# Patient Record
Sex: Male | Born: 1995 | Race: Black or African American | Hispanic: No | Marital: Single
Health system: Southern US, Community
[De-identification: ages and names within clinical notes are randomized; demographics above are authoritative.]

## PROBLEM LIST (undated history)

## (undated) DIAGNOSIS — R519 Headache, unspecified: Secondary | ICD-10-CM

## (undated) DIAGNOSIS — R51 Headache: Secondary | ICD-10-CM

---
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
## 2015-09-28 IMAGING — CT CT HEAD W/O CM
2 series · 16 of 30 positions shown, 20 images · non-contrast
Comparison: None.

CLINICAL DATA: Right-sided headache.

EXAM:
CT HEAD WITHOUT CONTRAST
TECHNIQUE: Contiguous axial images were obtained from the base of the skull
through the vertex without intravenous contrast.

[Series 201: head w/o, idose (1) · axial · non-contrast · 0.49mm/px · z∈[+99,+229]mm · 13 of 32 slices shown, 17 images]
[im 3/32  brain]
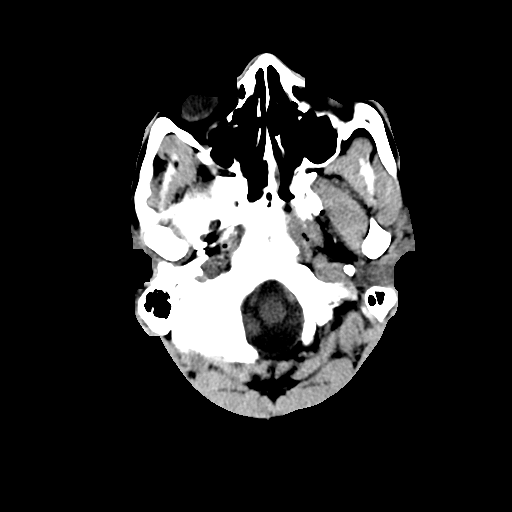
[im 3/32  bone]
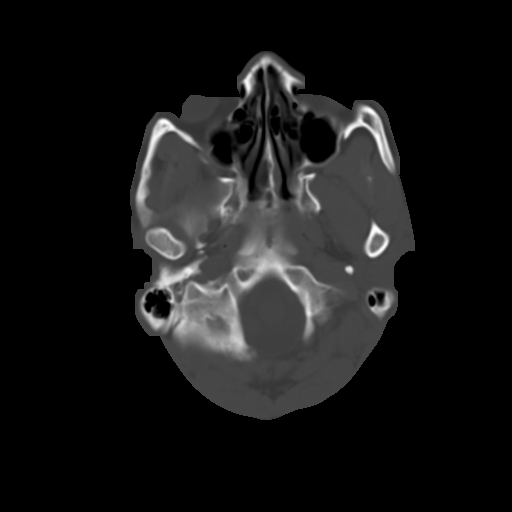
[im 5/32  brain]
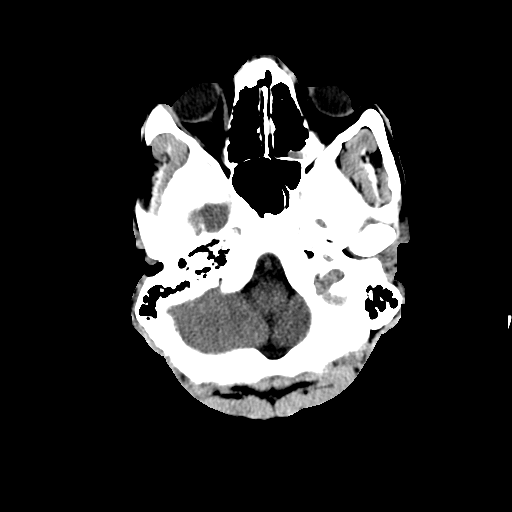
[im 7/32  brain]
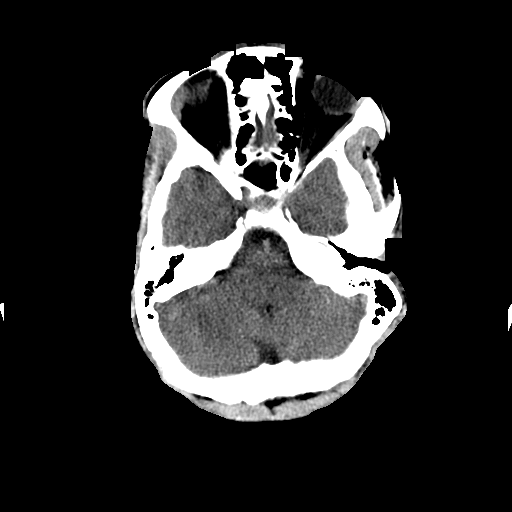
[im 9/32  brain]
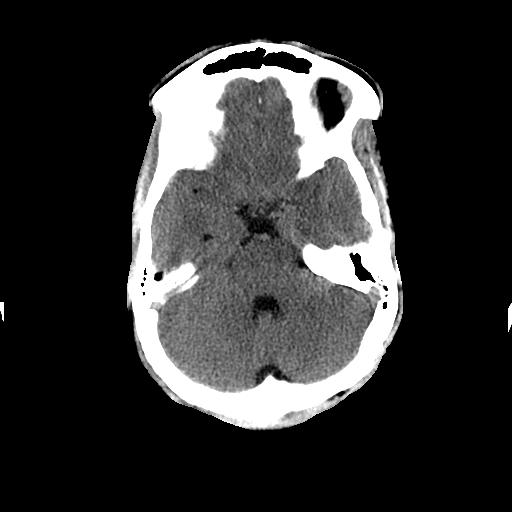
[im 12/32  brain]
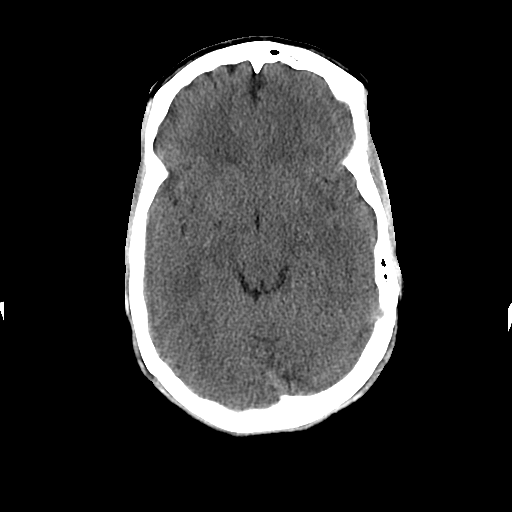
[im 12/32  bone]
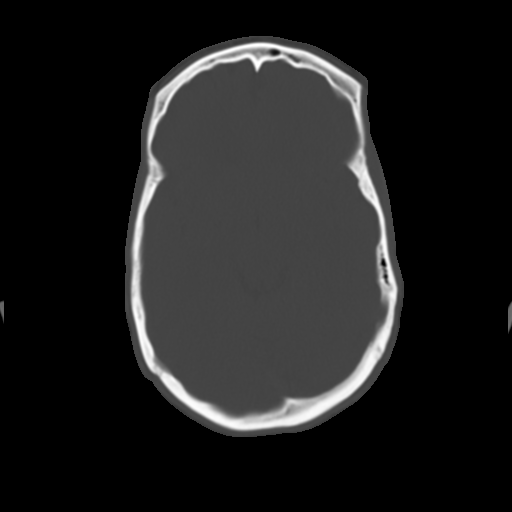
[im 14/32  brain]
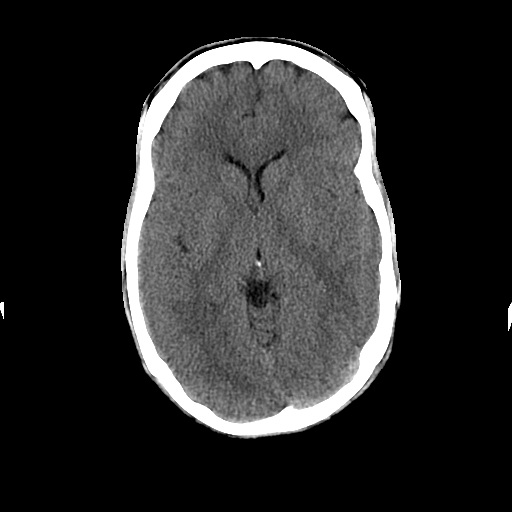
[im 16/32  brain]
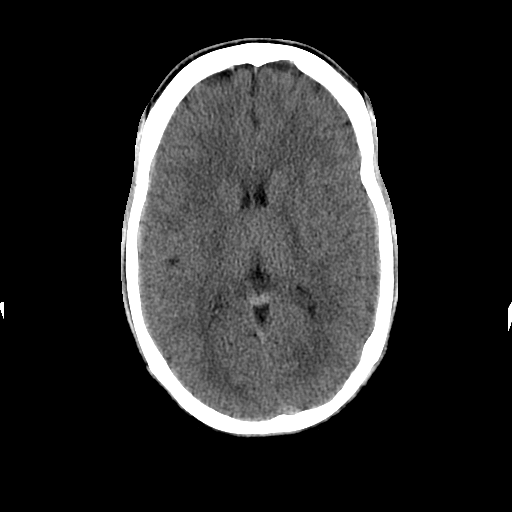
[im 18/32  brain]
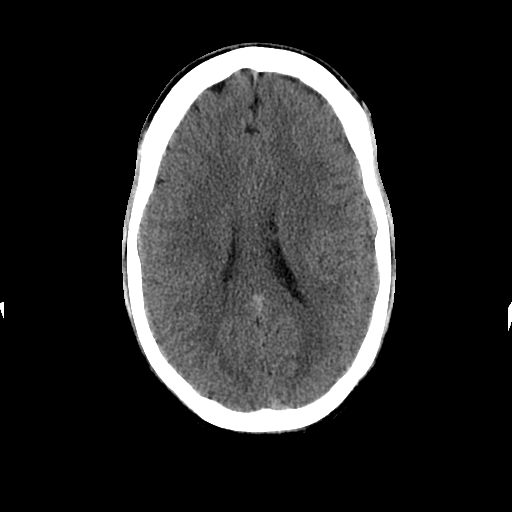
[im 20/32  brain]
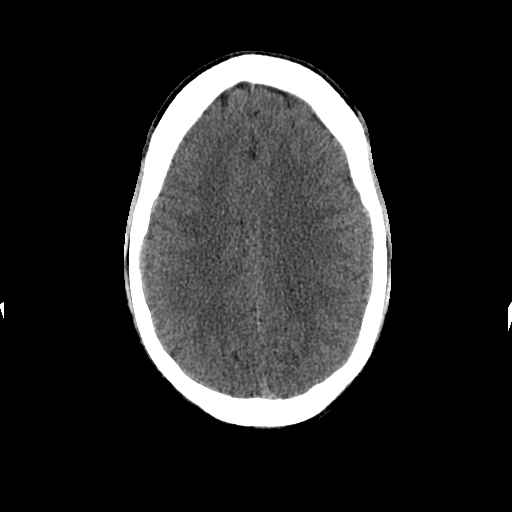
[im 20/32  bone]
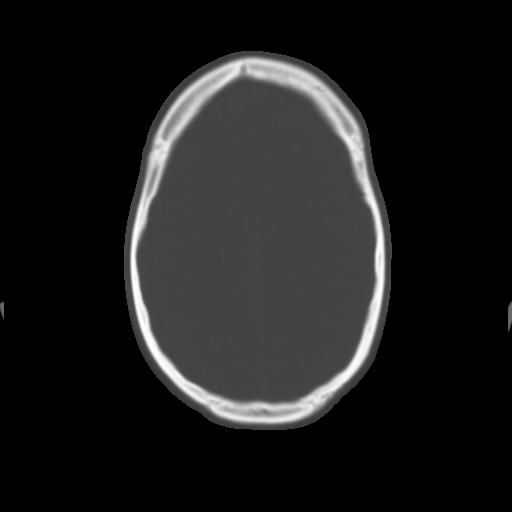
[im 23/32  brain]
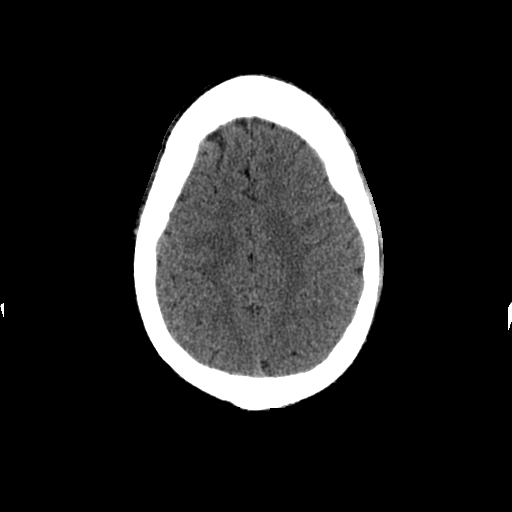
[im 25/32  brain]
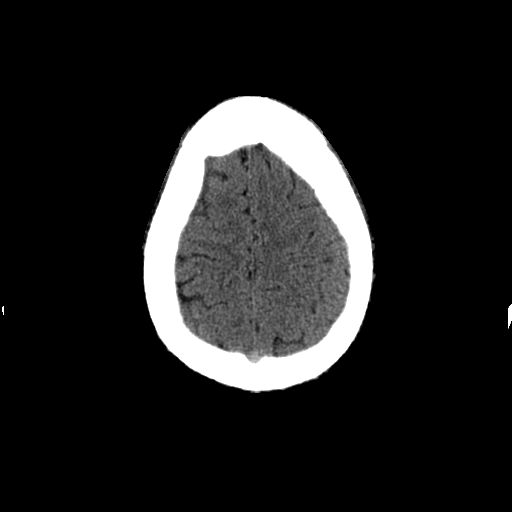
[im 27/32  brain]
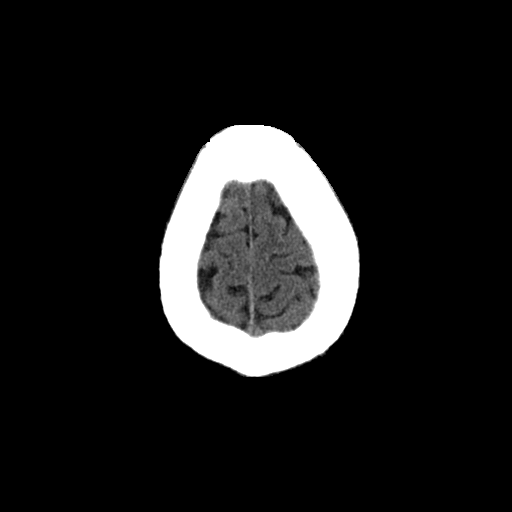
[im 29/32  brain]
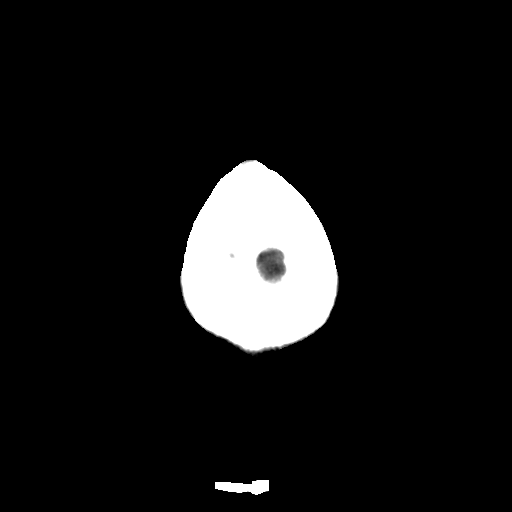
[im 29/32  bone]
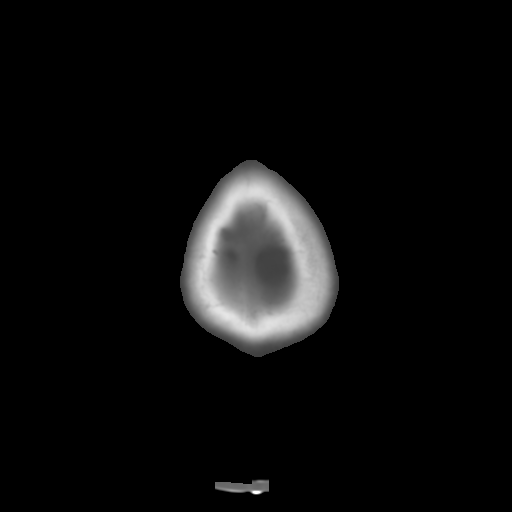

[Series 202: head w/o bone, idose (1) · axial · non-contrast · 0.49mm/px · z∈[+99,+144]mm · 3 of 32 slices shown]
[im 3/32  bone]
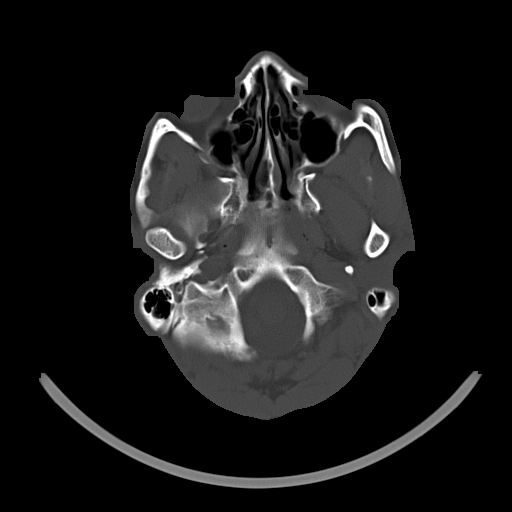
[im 7/32  bone]
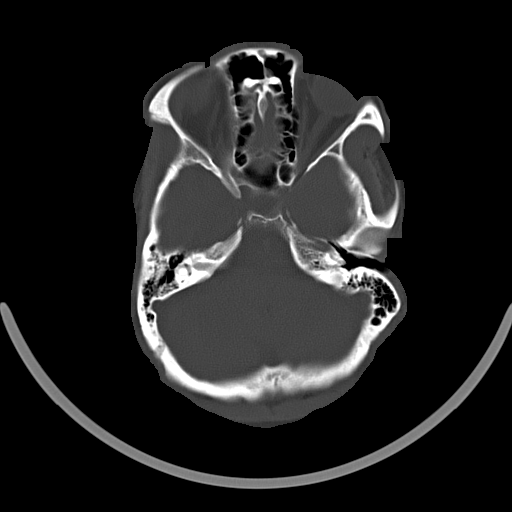
[im 12/32  bone]
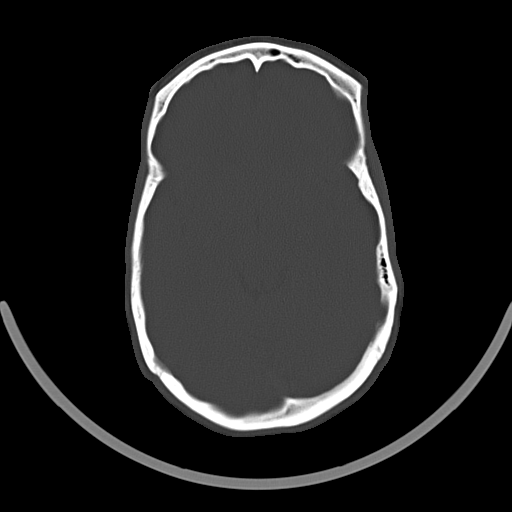

[16 of 30 positions shown; findings below may reference images not displayed]

FINDINGS: No acute cortical infarct, hemorrhage, or mass lesion ispresent.
Ventricles are of normal size. No significant extra-axial fluid
collection is present. Retention cyst versus polyp is noted in the
left maxillary sinus. Remaining paranasal sinuses and mastoid air
cells are clear. The skull is intact. The osseous skull is intact.
IMPRESSION: Normal brain.
